# Patient Record
Sex: Male | Born: 1995 | Race: Black or African American | Hispanic: No | Marital: Married | Smoking: Never smoker
Health system: Southern US, Community
[De-identification: ages and names within clinical notes are randomized; demographics above are authoritative.]

---
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
## 2019-08-18 ENCOUNTER — Other Ambulatory Visit: Payer: Self-pay

## 2019-08-18 ENCOUNTER — Ambulatory Visit (HOSPITAL_BASED_OUTPATIENT_CLINIC_OR_DEPARTMENT_OTHER)
Admission: EM | Admit: 2019-08-18 | Discharge: 2019-08-18 | Disposition: A | Discharge status: Home / Self Care | Payer: Self-pay | Attending: Emergency Medicine | Admitting: Emergency Medicine

## 2019-08-18 ENCOUNTER — Emergency Department (HOSPITAL_BASED_OUTPATIENT_CLINIC_OR_DEPARTMENT_OTHER): Payer: Self-pay

## 2019-08-18 ENCOUNTER — Emergency Department (HOSPITAL_COMMUNITY): Payer: Self-pay | Admitting: Anesthesiology

## 2019-08-18 ENCOUNTER — Encounter (HOSPITAL_BASED_OUTPATIENT_CLINIC_OR_DEPARTMENT_OTHER): Payer: Self-pay | Admitting: Emergency Medicine

## 2019-08-18 ENCOUNTER — Encounter (HOSPITAL_COMMUNITY)
Admission: EM | Disposition: A | Discharge status: Home / Self Care | Payer: Self-pay | Source: Home / Self Care | Attending: Emergency Medicine

## 2019-08-18 DIAGNOSIS — F17200 Nicotine dependence, unspecified, uncomplicated: Secondary | ICD-10-CM | POA: Insufficient documentation

## 2019-08-18 DIAGNOSIS — Z20822 Contact with and (suspected) exposure to covid-19: Secondary | ICD-10-CM | POA: Insufficient documentation

## 2019-08-18 DIAGNOSIS — S62339B Displaced fracture of neck of unspecified metacarpal bone, initial encounter for open fracture: Secondary | ICD-10-CM

## 2019-08-18 DIAGNOSIS — W228XXA Striking against or struck by other objects, initial encounter: Secondary | ICD-10-CM | POA: Insufficient documentation

## 2019-08-18 DIAGNOSIS — S66326A Laceration of extensor muscle, fascia and tendon of right little finger at wrist and hand level, initial encounter: Secondary | ICD-10-CM | POA: Insufficient documentation

## 2019-08-18 DIAGNOSIS — S62336B Displaced fracture of neck of fifth metacarpal bone, right hand, initial encounter for open fracture: Secondary | ICD-10-CM | POA: Insufficient documentation

## 2019-08-18 HISTORY — PX: OPEN REDUCTION INTERNAL FIXATION (ORIF) METACARPAL: SHX6234

## 2019-08-18 LAB — SARS CORONAVIRUS 2 BY RT PCR (HOSPITAL ORDER, PERFORMED IN ~~LOC~~ HOSPITAL LAB): SARS Coronavirus 2: NEGATIVE

## 2019-08-18 SURGERY — OPEN REDUCTION INTERNAL FIXATION (ORIF) METACARPAL
Anesthesia: General | Site: Finger | Laterality: Right

## 2019-08-18 MED ORDER — MIDAZOLAM HCL 2 MG/2ML IJ SOLN
INTRAMUSCULAR | Status: AC
  Filled 2019-08-18: qty 2

## 2019-08-18 MED ORDER — FENTANYL CITRATE (PF) 250 MCG/5ML IJ SOLN
INTRAMUSCULAR | Status: AC
  Filled 2019-08-18: qty 5

## 2019-08-18 MED ORDER — DEXAMETHASONE SODIUM PHOSPHATE 10 MG/ML IJ SOLN
INTRAMUSCULAR | Status: DC | PRN
Start: 2019-08-18 — End: 2019-08-18
  Administered 2019-08-18: 5 mg via INTRAVENOUS

## 2019-08-18 MED ORDER — ACETAMINOPHEN 500 MG PO TABS
ORAL_TABLET | ORAL | Status: AC
  Administered 2019-08-18: 1000 mg via ORAL
  Filled 2019-08-18: qty 2

## 2019-08-18 MED ORDER — FENTANYL CITRATE (PF) 100 MCG/2ML IJ SOLN: 100.0000 ug | Freq: Once | INTRAMUSCULAR | Status: AC

## 2019-08-18 MED ORDER — LACTATED RINGERS IV SOLN
INTRAVENOUS | Status: DC | PRN
Start: 2019-08-18 — End: 2019-08-18

## 2019-08-18 MED ORDER — ORAL CARE MOUTH RINSE: 15.0000 mL | Freq: Once | OROMUCOSAL | Status: DC

## 2019-08-18 MED ORDER — FENTANYL CITRATE (PF) 100 MCG/2ML IJ SOLN
INTRAMUSCULAR | Status: AC
  Administered 2019-08-18: 100 ug via INTRAVENOUS
  Filled 2019-08-18: qty 2

## 2019-08-18 MED ORDER — FENTANYL CITRATE (PF) 100 MCG/2ML IJ SOLN: 25.0000 ug | INTRAMUSCULAR | Status: DC | PRN

## 2019-08-18 MED ORDER — MIDAZOLAM HCL 2 MG/2ML IJ SOLN: 2.0000 mg | Freq: Once | INTRAMUSCULAR | Status: AC

## 2019-08-18 MED ORDER — MORPHINE SULFATE (PF) 4 MG/ML IV SOLN
4.0000 mg | Freq: Once | INTRAVENOUS | Status: AC
  Administered 2019-08-18: 4 mg via INTRAVENOUS
  Filled 2019-08-18: qty 1

## 2019-08-18 MED ORDER — ACETAMINOPHEN 500 MG PO TABS: 1000.0000 mg | ORAL_TABLET | Freq: Once | ORAL | Status: AC

## 2019-08-18 MED ORDER — PROMETHAZINE HCL 25 MG/ML IJ SOLN
6.2500 mg | INTRAMUSCULAR | Status: DC | PRN
Start: 2019-08-18 — End: 2019-08-18

## 2019-08-18 MED ORDER — CHLORHEXIDINE GLUCONATE 0.12 % MT SOLN: 15.0000 mL | Freq: Once | OROMUCOSAL | Status: DC

## 2019-08-18 MED ORDER — BUPIVACAINE HCL (PF) 0.25 % IJ SOLN
INTRAMUSCULAR | Status: AC
  Filled 2019-08-18: qty 30

## 2019-08-18 MED ORDER — LACTATED RINGERS IV SOLN: INTRAVENOUS | Status: DC

## 2019-08-18 MED ORDER — BUPIVACAINE HCL (PF) 0.25 % IJ SOLN
INTRAMUSCULAR | Status: DC | PRN
  Administered 2019-08-18: 10 mL

## 2019-08-18 MED ORDER — CEFAZOLIN SODIUM-DEXTROSE 1-4 GM/50ML-% IV SOLN
1.0000 g | Freq: Once | INTRAVENOUS | Status: AC
  Administered 2019-08-18: 1 g via INTRAVENOUS
  Filled 2019-08-18: qty 50

## 2019-08-18 MED ORDER — 0.9 % SODIUM CHLORIDE (POUR BTL) OPTIME
TOPICAL | Status: DC | PRN
  Administered 2019-08-18: 1000 mL

## 2019-08-18 MED ORDER — LIDOCAINE HCL (CARDIAC) PF 100 MG/5ML IV SOSY
PREFILLED_SYRINGE | INTRAVENOUS | Status: DC | PRN
  Administered 2019-08-18: 60 mg via INTRAVENOUS

## 2019-08-18 MED ORDER — CHLORHEXIDINE GLUCONATE 0.12 % MT SOLN
OROMUCOSAL | Status: AC
  Filled 2019-08-18: qty 15

## 2019-08-18 MED ORDER — CEFAZOLIN SODIUM-DEXTROSE 2-4 GM/100ML-% IV SOLN
INTRAVENOUS | Status: AC
  Filled 2019-08-18: qty 100

## 2019-08-18 MED ORDER — ROPIVACAINE HCL 5 MG/ML IJ SOLN
INTRAMUSCULAR | Status: DC | PRN
Start: 2019-08-18 — End: 2019-08-18
  Administered 2019-08-18: 30 mL via PERINEURAL

## 2019-08-18 MED ORDER — HYDROCODONE-ACETAMINOPHEN 5-325 MG PO TABS: 1.0000 | ORAL_TABLET | Freq: Four times a day (QID) | ORAL | 0 refills | Status: AC | PRN

## 2019-08-18 MED ORDER — ONDANSETRON HCL 4 MG/2ML IJ SOLN
INTRAMUSCULAR | Status: DC | PRN
  Administered 2019-08-18: 4 mg via INTRAVENOUS

## 2019-08-18 MED ORDER — MIDAZOLAM HCL 2 MG/2ML IJ SOLN
INTRAMUSCULAR | Status: AC
  Administered 2019-08-18: 2 mg via INTRAVENOUS
  Filled 2019-08-18: qty 2

## 2019-08-18 MED ORDER — CLONIDINE HCL (ANALGESIA) 100 MCG/ML EP SOLN
EPIDURAL | Status: DC | PRN
  Administered 2019-08-18: 100 ug

## 2019-08-18 MED ORDER — PROPOFOL 10 MG/ML IV BOLUS
INTRAVENOUS | Status: DC | PRN
  Administered 2019-08-18: 150 mg via INTRAVENOUS

## 2019-08-18 MED ORDER — FENTANYL CITRATE (PF) 100 MCG/2ML IJ SOLN
INTRAMUSCULAR | Status: DC | PRN
  Administered 2019-08-18: 50 ug via INTRAVENOUS

## 2019-08-18 MED ORDER — CEPHALEXIN 500 MG PO CAPS: 500.0000 mg | ORAL_CAPSULE | Freq: Four times a day (QID) | ORAL | 0 refills | Status: AC

## 2019-08-18 SURGICAL SUPPLY — 45 items
ALCOHOL 70% 16 OZ (MISCELLANEOUS) ×6 IMPLANT
BNDG ELASTIC 3X5.8 VLCR STR LF (GAUZE/BANDAGES/DRESSINGS) ×3 IMPLANT
BNDG ELASTIC 4X5.8 VLCR STR LF (GAUZE/BANDAGES/DRESSINGS) ×3 IMPLANT
BNDG ESMARK 4X9 LF (GAUZE/BANDAGES/DRESSINGS) ×3 IMPLANT
BNDG GAUZE ELAST 4 BULKY (GAUZE/BANDAGES/DRESSINGS) ×3 IMPLANT
CANISTER SUCT 3000ML PPV (MISCELLANEOUS) ×3 IMPLANT
CAP PIN PROTECTOR ORTHO WHT (CAP) ×9 IMPLANT
CHLORAPREP W/TINT 26 (MISCELLANEOUS) ×3 IMPLANT
CORD BIPOLAR FORCEPS 12FT (ELECTRODE) ×3 IMPLANT
COVER SURGICAL LIGHT HANDLE (MISCELLANEOUS) ×3 IMPLANT
COVER WAND RF STERILE (DRAPES) IMPLANT
CUFF TOURN SGL QUICK 18X4 (TOURNIQUET CUFF) ×3 IMPLANT
CUFF TOURN SGL QUICK 24 (TOURNIQUET CUFF)
CUFF TRNQT CYL 24X4X16.5-23 (TOURNIQUET CUFF) IMPLANT
DRAPE OEC MINIVIEW 54X84 (DRAPES) ×3 IMPLANT
DRAPE SURG 17X23 STRL (DRAPES) ×3 IMPLANT
DRSG XEROFORM 1X8 (GAUZE/BANDAGES/DRESSINGS) ×3 IMPLANT
GAUZE SPONGE 4X4 12PLY STRL (GAUZE/BANDAGES/DRESSINGS) ×3 IMPLANT
GAUZE SPONGE 4X4 16PLY XRAY LF (GAUZE/BANDAGES/DRESSINGS) ×3 IMPLANT
GAUZE XEROFORM 1X8 LF (GAUZE/BANDAGES/DRESSINGS) IMPLANT
GLOVE INDICATOR 8.0 STRL GRN (GLOVE) ×3 IMPLANT
GLOVE SURG SYN 7.5  E (GLOVE) ×3
GLOVE SURG SYN 7.5 E (GLOVE) ×1 IMPLANT
GOWN STRL REUS W/ TWL LRG LVL3 (GOWN DISPOSABLE) ×2 IMPLANT
GOWN STRL REUS W/TWL LRG LVL3 (GOWN DISPOSABLE) ×6
K-WIRE .035 (WIRE) ×15
KIT BASIN OR (CUSTOM PROCEDURE TRAY) ×3 IMPLANT
KIT TURNOVER KIT B (KITS) ×3 IMPLANT
KWIRE .035 (WIRE) ×5 IMPLANT
NEEDLE 22X1 1/2 (OR ONLY) (NEEDLE) IMPLANT
NS IRRIG 1000ML POUR BTL (IV SOLUTION) ×3 IMPLANT
PACK ORTHO EXTREMITY (CUSTOM PROCEDURE TRAY) ×3 IMPLANT
PAD ARMBOARD 7.5X6 YLW CONV (MISCELLANEOUS) ×6 IMPLANT
PAD CAST 3X4 CTTN HI CHSV (CAST SUPPLIES) ×1 IMPLANT
PAD CAST 4YDX4 CTTN HI CHSV (CAST SUPPLIES) ×1 IMPLANT
PADDING CAST COTTON 3X4 STRL (CAST SUPPLIES) ×3
PADDING CAST COTTON 4X4 STRL (CAST SUPPLIES) ×3
SUT VIC AB 3-0 PS2 18 (SUTURE) IMPLANT
SYR CONTROL 10ML LL (SYRINGE) IMPLANT
TOWEL GREEN STERILE (TOWEL DISPOSABLE) ×3 IMPLANT
TOWEL GREEN STERILE FF (TOWEL DISPOSABLE) ×3 IMPLANT
TUBE CONNECTING 12'X1/4 (SUCTIONS) ×1
TUBE CONNECTING 12X1/4 (SUCTIONS) ×2 IMPLANT
UNDERPAD 30X36 HEAVY ABSORB (UNDERPADS AND DIAPERS) ×3 IMPLANT
WATER STERILE IRR 1000ML POUR (IV SOLUTION) ×3 IMPLANT

## 2019-08-18 NOTE — Progress Notes (Signed)
No labs needed per Dr. Desmond Lope.
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
## 2019-08-18 NOTE — Anesthesia Procedure Notes (Signed)
Procedure Name: LMA Insertion Date/Time: 08/18/2019 10:14 AM Performed by: Gwenyth Allegra, CRNA Pre-anesthesia Checklist: Patient identified, Emergency Drugs available, Suction available, Patient being monitored and Timeout performed Patient Re-evaluated:Patient Re-evaluated prior to induction Oxygen Delivery Method: Circle system utilized Preoxygenation: Pre-oxygenation with 100% oxygen Induction Type: IV induction LMA: LMA inserted LMA Size: 4.0 Number of attempts: 1 Placement Confirmation: positive ETCO2 and breath sounds checked- equal and bilateral Tube secured with: Tape Dental Injury: Teeth and Oropharynx as per pre-operative assessment

## 2019-08-18 NOTE — Anesthesia Procedure Notes (Signed)
Anesthesia Regional Block: Axillary brachial plexus block   Pre-Anesthetic Checklist: ,, timeout performed, Correct Patient, Correct Site, Correct Laterality, Correct Procedure, Correct Position, site marked, Risks and benefits discussed,  Surgical consent,  Pre-op evaluation,  At surgeon's request and post-op pain management  Laterality: Right  Prep: chloraprep       Needles:  Injection technique: Single-shot  Needle Type: Echogenic Needle     Needle Length: 9cm  Needle Gauge: 21     Additional Needles:   Procedures:,,,, ultrasound used (permanent image in chart),,,,  Narrative:  Start time: 08/18/2019 9:40 AM End time: 08/18/2019 9:50 AM Injection made incrementally with aspirations every 5 mL.  Performed by: Personally  Anesthesiologist: Cecile Hearing, MD  Additional Notes: No pain on injection. No increased resistance to injection. Injection made in 5cc increments.  Good needle visualization.  Patient tolerated procedure well.

## 2019-08-18 NOTE — Anesthesia Preprocedure Evaluation (Addendum)
Anesthesia Evaluation  Patient identified by MRN, date of birth, ID band Patient awake    Reviewed: Allergy & Precautions, NPO status , Patient's Chart, lab work & pertinent test results  Airway Mallampati: II  TM Distance: >3 FB Neck ROM: Full    Dental  (+) Teeth Intact, Dental Advisory Given   Pulmonary Current Smoker and Patient abstained from smoking.,    Pulmonary exam normal breath sounds clear to auscultation       Cardiovascular negative cardio ROS Normal cardiovascular exam Rhythm:Regular Rate:Normal     Neuro/Psych Seizures -,  negative psych ROS   GI/Hepatic negative GI ROS, Neg liver ROS,   Endo/Other  negative endocrine ROS  Renal/GU negative Renal ROS     Musculoskeletal Right Metacarpal Fracture   Abdominal   Peds  Hematology negative hematology ROS (+)   Anesthesia Other Findings   Reproductive/Obstetrics                            Anesthesia Physical Anesthesia Plan  ASA: II  Anesthesia Plan: General   Post-op Pain Management:  Regional for Post-op pain   Induction: Intravenous  PONV Risk Score and Plan: 2 and Treatment may vary due to age or medical condition, Dexamethasone, Ondansetron and Midazolam  Airway Management Planned: LMA  Additional Equipment:   Intra-op Plan:   Post-operative Plan: Extubation in OR  Informed Consent: I have reviewed the patients History and Physical, chart, labs and discussed the procedure including the risks, benefits and alternatives for the proposed anesthesia with the patient or authorized representative who has indicated his/her understanding and acceptance.     Dental advisory given  Plan Discussed with: CRNA  Anesthesia Plan Comments:        Anesthesia Quick Evaluation

## 2019-08-18 NOTE — Consult Note (Signed)
ORTHOPAEDIC CONSULTATION  REQUESTING PHYSICIAN: No att. providers found  PCP:  Milford Cage Francoise Schaumann, MD (Inactive)  Chief Complaint: Right hand pain  HPI: Kenneth Nelson is a 24 y.o. male who complains of right hand pain.  Last night the patient punched a metal sign.  He had immediate pain as well as bleeding from the wound on his right hand.  He was seen at [facility] where he was found to have a significantly displaced right fifth metacarpal neck fracture with concern for an open fracture.  Hand surgery was consulted for further care.  He states that his pain is remained constant in his hand.  He received IV Ancef at [facility].  He was transferred to [facility] for definitive treatment of his right hand open fracture.  He denies numbness or tingling to the digits.  He had no pain in the hand prior to the injury.  History reviewed. No pertinent past medical history. History reviewed. No pertinent surgical history. Social History   Socioeconomic History  . Marital status: Single    Spouse name: Not on file  . Number of children: Not on file  . Years of education: Not on file  . Highest education level: Not on file  Occupational History  . Not on file  Tobacco Use  . Smoking status: Never Smoker  . Smokeless tobacco: Never Used  Substance and Sexual Activity  . Alcohol use: No  . Drug use: No  . Sexual activity: Never  Other Topics Concern  . Not on file  Social History Narrative  . Not on file   Social Determinants of Health   Financial Resource Strain:   . Difficulty of Paying Living Expenses:   Food Insecurity:   . Worried About Programme researcher, broadcasting/film/video in the Last Year:   . Barista in the Last Year:   Transportation Needs:   . Freight forwarder (Medical):   Marland Kitchen Lack of Transportation (Non-Medical):   Physical Activity:   . Days of Exercise per Week:   . Minutes of Exercise per Session:   Stress:   . Feeling of Stress :   Social  Connections:   . Frequency of Communication with Friends and Family:   . Frequency of Social Gatherings with Friends and Family:   . Attends Religious Services:   . Active Member of Clubs or Organizations:   . Attends Banker Meetings:   Marland Kitchen Marital Status:    Family History  Problem Relation Age of Onset  . Sudden death Neg Hx   . Heart attack Neg Hx    No Known Allergies Prior to Admission medications   Medication Sig Start Date End Date Taking? Authorizing Provider  sulfamethoxazole-trimethoprim (SEPTRA) 400-80 MG per tablet Take 1 tablet by mouth 2 (two) times daily. 11/20/12   Elson Areas, PA-C   DG Hand Complete Right  Result Date: 08/18/2019 CLINICAL DATA:  Punched a street sign, deformity EXAM: RIGHT HAND - COMPLETE 3+ VIEW COMPARISON:  None. FINDINGS: Frontal, oblique, and lateral views of the right hand are obtained. There is an impacted comminuted intra-articular fracture of the distal aspect of the fifth metacarpal, with volar angulation. Marked overlying soft tissue swelling. No other acute displaced fractures. IMPRESSION: 1. Comminuted impacted intra-articular fracture distal aspect fifth metacarpal, with volar angulation. Electronically Signed   By: Sharlet Salina M.D.   On: 08/18/2019 03:57    Positive ROS: All other systems  have been reviewed and were otherwise negative with the exception of those mentioned in the HPI and as above.  Physical Exam: General: Alert, no acute distress Cardiovascular: No edema Respiratory: No cyanosis, no use of accessory musculature Skin: 2 mm open wound over the dorsal aspect of the fifth metacarpal neck with oozing blood Psychiatric: Patient is competent for consent with normal mood and affect  MUSCULOSKELETAL: Examination of the right hand shows a 2 mm wound over the dorsal aspect of the fifth metacarpal neck with oozing blood.  There is mild swelling to the hand.  He has tenderness palpation to the fifth metacarpal.  He  has no tenderness elsewhere in the hand.  He has palpable flexion deformity of the fifth metacarpal as well as internal rotation of the digit.  Range of motion was unable to be evaluated secondary to his hand pain.  His fingertips are warm well perfused with brisk capillary refill.  His sensation is grossly intact light touch throughout all digits.  Assessment: Displaced and open right fifth metacarpal neck fracture  Plan: I discussed treatment options with the patient I did recommend proceeding forward with irrigation debridement of his right fifth metacarpal open fracture.  Plan would be to then perform surgical fixation either with plate and screw construct, intramedullary screw fixation or K wires depending on the stability of the fracture.  The risks of surgery were discussed with the patient.  These risks include but not limited to infection, bleeding, damage to surrounding structures including blood vessels and nerves, pain, stiffness, malunion, nonunion, implant failure and need for additional procedures.  Informed consent was obtained.  The patient's right hand was marked.  Plan for discharge home postoperatively with close follow-up with me in clinic to continue postoperative care and rehab.    Ernest Mallick, MD [phone]   08/18/2019 9:37 AM

## 2019-08-18 NOTE — Transfer of Care (Signed)
Immediate Anesthesia Transfer of Care Note  Patient: Kenneth Nelson  Procedure(s) Performed: OPEN REDUCTION INTERNAL FIXATION (ORIF) METACARPAL (Right Finger)  Patient Location: PACU  Anesthesia Type:GA combined with regional for post-op pain  Level of Consciousness: awake, alert  and oriented  Airway & Oxygen Therapy: Patient Spontanous Breathing  Post-op Assessment: Report given to RN and Post -op Vital signs reviewed and stable  Post vital signs: Reviewed and stable  Last Vitals:  Vitals Value Taken Time  BP 107/81 08/18/19 1206  Temp 36.7 C 08/18/19 1205  Pulse 55 08/18/19 1213  Resp 11 08/18/19 1213  SpO2 99 % 08/18/19 1213  Vitals shown include unvalidated device data.  Last Pain:  Vitals:   08/18/19 1205  TempSrc:   PainSc: Asleep      Patients Stated Pain Goal: 4 (08/18/19 0933)  Complications: No complications documented.

## 2019-08-18 NOTE — Anesthesia Procedure Notes (Signed)
Anesthesia Regional Block: Axillary brachial plexus block   Pre-Anesthetic Checklist: ,, timeout performed, Correct Patient, Correct Site, Correct Laterality, Correct Procedure, Correct Position, site marked, Risks and benefits discussed,  Surgical consent,  Pre-op evaluation,  At surgeon's request and post-op pain management  Laterality: Right  Prep: chloraprep       Needles:  Injection technique: Single-shot  Needle Type: Echogenic Needle     Needle Length: 9cm  Needle Gauge: 21     Additional Needles:   Procedures:,,,, ultrasound used (permanent image in chart),,,,  Narrative:  Start time: 08/18/2019 9:40 AM End time: 08/18/2019 9:50 AM Injection made incrementally with aspirations every 5 mL.  Performed by: Personally  Anesthesiologist: Evalyse Stroope Edward, MD  Additional Notes: No pain on injection. No increased resistance to injection. Injection made in 5cc increments.  Good needle visualization.  Patient tolerated procedure well.        

## 2019-08-18 NOTE — Anesthesia Postprocedure Evaluation (Signed)
Anesthesia Post Note  Patient: Kenneth Nelson  Procedure(s) Performed: OPEN REDUCTION INTERNAL FIXATION (ORIF) METACARPAL (Right Finger)     Patient location during evaluation: PACU Anesthesia Type: General Level of consciousness: awake and alert and awake Pain management: pain level controlled Vital Signs Assessment: post-procedure vital signs reviewed and stable Respiratory status: spontaneous breathing, nonlabored ventilation, respiratory function stable and patient connected to nasal cannula oxygen Cardiovascular status: blood pressure returned to baseline and stable Postop Assessment: no apparent nausea or vomiting Anesthetic complications: no   No complications documented.  Last Vitals:  Vitals:   08/18/19 1220 08/18/19 1235  BP: 105/76 95/73  Pulse: 55 53  Resp: 12 12  Temp:    SpO2: 100% 100%    Last Pain:  Vitals:   08/18/19 1235  TempSrc:   PainSc: 0-No pain                 Cecile Hearing

## 2019-08-18 NOTE — Discharge Instructions (Signed)
Discharge Instructions  - Keep dressings in place. Do not remove them. - The dressings must stay dry - Take all medication as prescribed. Transition to over the counter pain medication as your pain improves - Keep the hand elevated over the next 48-72 hours to help with pain and swelling - Move all digits not restricted by the dressings regularly to prevent stiffness - Please call to schedule a follow up appointment with Dr. Wendelin Reader and therapy at [phone] for 10-14 days following surgery - Your pain medication have been sent digitally to your pharmacy  

## 2019-08-18 NOTE — ED Provider Notes (Signed)
[facility] EMERGENCY DEPARTMENT Provider Note  CSN: [id number] Arrival date & time: 08/18/19 0254  Chief Complaint(s) Hand Injury  HPI Kenneth Nelson is a 24 y.o. male   CC: Right hand pain  Onset/Duration: 30 minutes prior to arrival Timing: Constant unchanged Quality: Throbbing Severity: Severe Modifying Factors:  Improved by: Immobility  Worsened by: Movement and palpation Associated Signs/Symptoms:  Pertinent (+): Bleeding and wound over the deformity, difficulty moving his pinky  Pertinent (-): Numbness or tingling Context: Patient reports that he punched a handicap sign because he was mad.  Up-to-date on tetanus vaccination   HPI  Past Medical History History reviewed. No pertinent past medical history. Patient Active Problem List   Diagnosis Date Noted  . Sports physical 07/03/2011   Home Medication(s) Prior to Admission medications   Medication Sig Start Date End Date Taking? Authorizing Provider  sulfamethoxazole-trimethoprim (SEPTRA) 400-80 MG per tablet Take 1 tablet by mouth 2 (two) times daily. 11/20/12   Osie Cheeks                                                                                                                                    Past Surgical History History reviewed. No pertinent surgical history. Family History Family History  Problem Relation Age of Onset  . Sudden death Neg Hx   . Heart attack Neg Hx     Social History Social History   Tobacco Use  . Smoking status: Never Smoker  . Smokeless tobacco: Never Used  Substance Use Topics  . Alcohol use: No  . Drug use: No   Allergies Patient has no known allergies.  Review of Systems Review of Systems All other systems are reviewed and are negative for acute change except as noted in the HPI  Physical Exam Vital Signs  I have reviewed the triage vital signs BP 114/80 (BP Location: Left Arm)   Pulse 74   Temp 98.5 F (36.9 C) (Oral)   Resp 20   Ht  5\' 8"  (1.727 m)   Wt 64.4 kg   SpO2 100%   BMI 21.59 kg/m   Physical Exam Vitals reviewed.  Constitutional:      General: He is not in acute distress.    Appearance: He is well-developed. He is not diaphoretic.  HENT:     Head: Normocephalic and atraumatic.     Jaw: No trismus.     Right Ear: External ear normal.     Left Ear: External ear normal.     Nose: Nose normal.  Eyes:     General: No scleral icterus.    Conjunctiva/sclera: Conjunctivae normal.  Neck:     Trachea: Phonation normal.  Cardiovascular:     Rate and Rhythm: Normal rate and regular rhythm.  Pulmonary:     Effort: Pulmonary effort is normal. No respiratory distress.     Breath sounds: No stridor.  Abdominal:  General: There is no distension.  Musculoskeletal:        General: Normal range of motion.       Hands:     Cervical back: Normal range of motion.  Neurological:     Mental Status: He is alert and oriented to person, place, and time.  Psychiatric:        Behavior: Behavior normal.     ED Results and Treatments Labs (all labs ordered are listed, but only abnormal results are displayed) Labs Reviewed  SARS CORONAVIRUS 2 BY RT PCR (HOSPITAL ORDER, PERFORMED IN [place] [facility])                                                                                                                         EKG  EKG Interpretation  Date/Time:    Ventricular Rate:    PR Interval:    QRS Duration:   QT Interval:    QTC Calculation:   R Axis:     Text Interpretation:        Radiology DG Hand Complete Right  Result Date: 08/18/2019 CLINICAL DATA:  Punched a street sign, deformity EXAM: RIGHT HAND - COMPLETE 3+ VIEW COMPARISON:  None. FINDINGS: Frontal, oblique, and lateral views of the right hand are obtained. There is an impacted comminuted intra-articular fracture of the distal aspect of the fifth metacarpal, with volar angulation. Marked overlying soft tissue swelling. No other acute  displaced fractures. IMPRESSION: 1. Comminuted impacted intra-articular fracture distal aspect fifth metacarpal, with volar angulation. Electronically Signed   By: Sharlet Salina M.D.   On: 08/18/2019 03:57    Pertinent labs & imaging results that were available during my care of the patient were reviewed by me and considered in my medical decision making (see chart for details).  Medications Ordered in ED Medications  ceFAZolin (ANCEF) IVPB 1 g/50 mL premix (0 g Intravenous Stopped 08/18/19 0509)  morphine 4 MG/ML injection 4 mg (4 mg Intravenous Given 08/18/19 0620)                                                                                                                                    Procedures .Critical Care Performed by: Nira Conn, MD Authorized by: Nira Conn, MD    CRITICAL CARE Performed by: Amadeo Garnet Apryl Brymer Total critical care time: 35 minutes Critical care time was exclusive of separately billable  procedures and treating other patients. Critical care was necessary to treat or prevent imminent or life-threatening deterioration. Critical care was time spent personally by me on the following activities: development of treatment plan with patient and/or surrogate as well as nursing, discussions with consultants, evaluation of patient's response to treatment, examination of patient, obtaining history from patient or surrogate, ordering and performing treatments and interventions, ordering and review of laboratory studies, ordering and review of radiographic studies, pulse oximetry and re-evaluation of patient's condition.    (including critical care time)  Medical Decision Making / ED Course I have reviewed the nursing notes for this encounter and the patient's prior records (if available in EHR or on provided paperwork).   Rhett Mutschler Rolston was evaluated in Emergency Department on 08/18/2019 for the symptoms described in the history of present  illness. He was evaluated in the context of the global COVID-19 pandemic, which necessitated consideration that the patient might be at risk for infection with the SARS-CoV-2 virus that causes COVID-19. Institutional protocols and algorithms that pertain to the evaluation of patients at risk for COVID-19 are in a state of rapid change based on information released by regulatory bodies including the CDC and federal and state organizations. These policies and algorithms were followed during the patient's care in the ED.  Plain film confirmed boxer's fracture.  There is a jagged piece of the metacarpal which may be the source of the puncture wound just above.  This is concerning for open fracture.  Patient given Ancef.  Case discussed with Dr. Roney Mans of hand surgery who requested patient be transferred to [facility] for operative management.        Final Clinical Impression(s) / ED Diagnoses Final diagnoses:  Open boxer's fracture, initial encounter      This chart was dictated using voice recognition software.  Despite best efforts to proofread,  errors can occur which can change the documentation meaning.   Nira Conn, MD 08/18/19 [phone]

## 2019-08-19 ENCOUNTER — Encounter (HOSPITAL_COMMUNITY): Payer: Self-pay | Admitting: Orthopaedic Surgery

## 2019-09-04 ENCOUNTER — Ambulatory Visit: Payer: Self-pay | Attending: Orthopaedic Surgery | Admitting: *Deleted

## 2019-09-04 DIAGNOSIS — R278 Other lack of coordination: Secondary | ICD-10-CM | POA: Insufficient documentation

## 2019-09-04 DIAGNOSIS — R601 Generalized edema: Secondary | ICD-10-CM | POA: Insufficient documentation

## 2019-09-04 DIAGNOSIS — M79644 Pain in right finger(s): Secondary | ICD-10-CM | POA: Insufficient documentation

## 2019-09-04 DIAGNOSIS — M25541 Pain in joints of right hand: Secondary | ICD-10-CM | POA: Insufficient documentation

## 2019-09-04 DIAGNOSIS — M6281 Muscle weakness (generalized): Secondary | ICD-10-CM | POA: Insufficient documentation

## 2019-09-05 ENCOUNTER — Other Ambulatory Visit: Payer: Self-pay

## 2019-09-05 ENCOUNTER — Encounter: Payer: Self-pay | Admitting: *Deleted

## 2019-09-05 ENCOUNTER — Ambulatory Visit: Payer: Self-pay | Admitting: *Deleted

## 2019-09-05 DIAGNOSIS — M6281 Muscle weakness (generalized): Secondary | ICD-10-CM

## 2019-09-05 DIAGNOSIS — R278 Other lack of coordination: Secondary | ICD-10-CM

## 2019-09-05 DIAGNOSIS — R601 Generalized edema: Secondary | ICD-10-CM

## 2019-09-05 DIAGNOSIS — M79644 Pain in right finger(s): Secondary | ICD-10-CM

## 2019-09-05 DIAGNOSIS — M25541 Pain in joints of right hand: Secondary | ICD-10-CM

## 2019-09-05 NOTE — Patient Instructions (Signed)
WEARING SCHEDULE:  Wear splint at ALL times except for hygiene care. May remove splint for pin care only, then reapply. Do not move or use that hand for anything at this time, your tendon and fractures need time to heal.  PURPOSE:  To prevent movement and for protection until injury can heal  CARE OF SPLINT:  Keep splint away from heat sources including: stove, radiator or furnace, or a car in sunlight. The splint can melt and will no longer fit you properly  Keep away from pets and children  Spot lean the splint with rubbing alcohol as needed.  * During this time, make sure you also clean your hand/arm as instructed by your therapist and/or perform dressing changes as needed. Then dry hand/arm completely before replacing splint. (When cleaning hand/arm, keep it immobilized in same position until splint is replaced)  PRECAUTIONS/POTENTIAL PROBLEMS: *If you notice or experience increased pain, swelling, numbness, or a lingering reddened area from the splint: Contact your therapist immediately by calling [phone]. You must wear the splint for protection, but we will get you scheduled for adjustments as quickly as possible.  (If only straps or hooks need to be replaced and NO adjustments to the splint need to be made, just call the office ahead and let them know you are coming in)  If you have any medical concerns or signs of infection, please call your doctor immediately
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
## 2019-09-12 ENCOUNTER — Ambulatory Visit: Payer: Self-pay | Admitting: *Deleted

## 2019-09-12 ENCOUNTER — Other Ambulatory Visit: Payer: Self-pay

## 2019-09-12 ENCOUNTER — Encounter: Payer: Self-pay | Admitting: *Deleted

## 2019-09-12 DIAGNOSIS — M25541 Pain in joints of right hand: Secondary | ICD-10-CM

## 2019-09-12 DIAGNOSIS — R278 Other lack of coordination: Secondary | ICD-10-CM

## 2019-09-12 DIAGNOSIS — R601 Generalized edema: Secondary | ICD-10-CM

## 2019-09-12 DIAGNOSIS — M6281 Muscle weakness (generalized): Secondary | ICD-10-CM

## 2019-09-12 DIAGNOSIS — M79644 Pain in right finger(s): Secondary | ICD-10-CM

## 2019-09-12 NOTE — Therapy (Signed)
[facility] [address] [place], [place], [address] Phone: [phone]   Fax:  [phone]  Occupational Therapy Treatment  Patient Details  Name: Kenneth Nelson MRN: [id number] Date of Birth: 07/28/1995 Referring Provider (OT): Dr Candi Leash   Encounter Date: 09/12/2019   OT End of Session - 09/12/19 1209    Visit Number 2    Number of Visits 16    Date for OT Re-Evaluation 10/31/19    Authorization Type Pt is currently self pay. He was going to ask the front office for handouts [id number] financial assistance as he is currently unable to work.    OT Start Time 1111    OT Stop Time 1154    OT Time Calculation (min) 43 min    Activity Tolerance Patient tolerated treatment well;No increased pain    Behavior During Therapy [facility] for tasks assessed/performed           History reviewed. No pertinent past medical history.  Past Surgical History:  Procedure Laterality Date  . OPEN REDUCTION INTERNAL FIXATION (ORIF) METACARPAL Right 08/18/2019   Procedure: OPEN REDUCTION INTERNAL FIXATION (ORIF) METACARPAL;  Surgeon: Ernest Mallick, MD;  Location: [facility] OR;  Service: Orthopedics;  Laterality: Right;    There were no vitals filed for this visit.   Subjective Assessment - 09/12/19 1114    Subjective  Pt reports that he feels as though some of the pins may have moved. He states that he is having some minor pain related to the pins being in his hand.    Pertinent History Non significant per pt report.    Patient Stated Goals Get hand back to normal    Currently in Pain? Yes    Pain Score 7     Pain Location Hand    Pain Orientation Right    Pain Descriptors / Indicators Aching;Pressure;Throbbing    Pain Type Acute pain    Pain Onset 1 to 4 weeks ago    Pain Frequency Intermittent    Aggravating Factors  Pt reports increased pain when resting hand on its ulnar side within the splint as pins can be felt.    Pain  Relieving Factors Repositioning hand/forearm    Multiple Pain Sites No                        OT Treatments/Exercises (OP) - 09/12/19 0001      ADLs   ADL Comments Pt was advised to maintain neutral wrist/forearm position as he may be unintentionally moving too much within his splint causing iirritation at his pin sites. Sleeping and resting positioning and edema control was also reviewed verbally in the clinic today.      Splinting   Splinting Pt protective splint was removed and minor adjustments were made to increase comfort/fit around pin sites, most notably at ulnar wrist just distal to ulnar styloid. Straping and velcro was also replaced as pt appears to have been removing straps frequently as they were not completely adhered any more. Pt had c/o soreness at 4th website/pin area and ulnar sided  wrist area, it was discussed that he may be moving too much within the splint during the day ie> during ADL's etc he may be pronating and supinating his forearm unintentionally which would cause some irritation at the ulnar styloid and pins sites. He was advised to keep his forearm in the neutral position for comfort and adjust straps as needed. He also  reports that he does not have a f/u appointment iwth Dr Roney Mans and had attempted to call the office to schedule a f/u to check pin sites and further recommendations for progression of care following their removal. He stated that he planned to go to the doctor's office after leaving here to make an appointment.                   OT Education - 09/12/19 1208    Education Details Splint use, care and precautions. Maintain neutral wrist/forearm position for comfort. Avoid functional use R hand during ADL's as it appears pt has moved some within the splint possibly causing irritation at ulnar sided pin sites.    Person(s) Educated Patient;Other (comment)   brother   Methods Explanation;Demonstration;Verbal cues;Handout     Comprehension Verbalized understanding;Returned demonstration;Verbal cues required            OT Short Term Goals - 09/05/19 1131      OT SHORT TERM GOAL #1   Title Pt will be Mod I splinting use, care and precautions R hand/wrist.    Baseline req min-mod vc's to not use right hand.or move fingers within splint    Time 4    Period Weeks    Status New    Target Date 10/03/19      OT SHORT TERM GOAL #2   Title Pt will be Mod I edema control techniques R UE    Baseline Min-mod edema noted R hand/fingers    Time 4    Period Weeks    Status New    Target Date 10/03/19      OT SHORT TERM GOAL #3   Title Pt will be Mod I pin care R dorsal hand    Baseline req Min vc's    Time 4    Period Weeks    Status New    Target Date 10/03/19             OT Long Term Goals - 09/05/19 1133      OT LONG TERM GOAL #1   Title Pt will be Mod I upgraded HEP    Baseline dependent    Time 8    Period Weeks    Status New    Target Date 10/31/19      OT LONG TERM GOAL #2   Title Pt will demonstrate right small finger active flexion to his [facility] or be able to make a full fist during functional ADL's    Baseline unable due to fracture and tendon repairs    Time 8    Period Weeks    Status New    Target Date 10/31/19      OT LONG TERM GOAL #3   Title Pt will be able to extend his right ring and small fingers prior to grasp/release & picking up light objects during ADL's as seen in clinic.    Baseline Unable    Time 8    Period Weeks    Status New    Target Date 10/31/19      OT LONG TERM GOAL #4   Title Pt will report decreased pain of 3/10 or less with functional use of R hand during slimulated ADL's.    Baseline Unable    Time 8    Period Weeks    Status New    Target Date 10/31/19                 Plan -  09/12/19 1210    Clinical Impression Statement Pt was seen today for protective splint check and adjustments. Pt should benefit from maintaining neutral wrist  position throughout the day as he has c/o pin mirgration and pain in 4th webspace as well as ulnar side or wrist. Pt may be unintentionally pronating and supinating R forearm throughout the day therefore causing irritation at pin sites. He should benefit from f/u Dr Roney Mans and progression of HEP as per updated orders from MD following pin removal.    OT Occupational Profile and History Problem Focused Assessment - Including review of records relating to presenting problem    Occupational performance deficits (Please refer to evaluation for details): ADL's    Body Structure / Function / Physical Skills ADL;Strength;Dexterity;Pain;Edema;UE functional use;ROM;Scar mobility;Coordination;Flexibility;Mobility;Sensation;Wound;FMC    Rehab Potential Good    Clinical Decision Making Limited treatment options, no task modification necessary    Comorbidities Affecting Occupational Performance: May have comorbidities impacting occupational performance    Modification or Assistance to Complete Evaluation  No modification of tasks or assist necessary to complete eval    OT Frequency 2x / week    OT Duration 8 weeks    OT Treatment/Interventions Self-care/ADL training;Fluidtherapy;Splinting;Therapeutic activities;Ultrasound;Therapeutic exercise;Scar mobilization;Passive range of motion;Paraffin;Manual Therapy;Patient/family education    Plan Splint check and adjustments R hand/wrist, edema control, progression of HEP following pin removal as per Dr Hinda Glatter orders.    Consulted and Agree with Plan of Care Patient           Patient will benefit from skilled therapeutic intervention in order to improve the following deficits and impairments:   Body Structure / Function / Physical Skills: ADL, Strength, Dexterity, Pain, Edema, UE functional use, ROM, Scar mobility, Coordination, Flexibility, Mobility, Sensation, Wound, FMC       Visit Diagnosis: Pain in joint of right hand  Pain in right  finger(s)  Generalized edema  Muscle weakness (generalized)  Other lack of coordination    Problem List Patient Active Problem List   Diagnosis Date Noted  . Sports physical 07/03/2011    Alm Bustard, OTR/L 09/12/2019, 12:15 PM  [facility] [address] [place], [place], [address] Phone: [phone]   Fax:  [phone]  Name: Kenneth Nelson MRN: [id number] Date of Birth: 04/10/1995

## 2019-09-18 ENCOUNTER — Encounter: Payer: Self-pay | Admitting: *Deleted

## 2019-09-18 ENCOUNTER — Ambulatory Visit: Payer: Self-pay | Admitting: *Deleted

## 2019-09-18 ENCOUNTER — Other Ambulatory Visit: Payer: Self-pay

## 2019-09-18 DIAGNOSIS — M6281 Muscle weakness (generalized): Secondary | ICD-10-CM

## 2019-09-18 DIAGNOSIS — M79644 Pain in right finger(s): Secondary | ICD-10-CM

## 2019-09-18 DIAGNOSIS — R278 Other lack of coordination: Secondary | ICD-10-CM

## 2019-09-18 DIAGNOSIS — R601 Generalized edema: Secondary | ICD-10-CM

## 2019-09-18 DIAGNOSIS — M25541 Pain in joints of right hand: Secondary | ICD-10-CM

## 2019-09-18 NOTE — Therapy (Signed)
[facility] [address] [place], [place], [address] Phone: [phone]   Fax:  [phone]  Occupational Therapy Treatment  Patient Details  Name: Kenneth Nelson MRN: [id number] Date of Birth: 02/12/1995 Referring Provider (OT): Dr Candi Leash   Encounter Date: 09/18/2019   OT End of Session - 09/18/19 1202    Visit Number 3    Number of Visits 16    Date for OT Re-Evaluation 10/31/19    Authorization Type Pt is currently self pay. He was going to ask the front office for handouts [id number] financial assistance as he is currently unable to work.    OT Start Time 1059    OT Stop Time 1145    OT Time Calculation (min) 46 min    Activity Tolerance Patient tolerated treatment well;No increased pain    Behavior During Therapy [facility] for tasks assessed/performed           History reviewed. No pertinent past medical history.  Past Surgical History:  Procedure Laterality Date  . OPEN REDUCTION INTERNAL FIXATION (ORIF) METACARPAL Right 08/18/2019   Procedure: OPEN REDUCTION INTERNAL FIXATION (ORIF) METACARPAL;  Surgeon: Ernest Mallick, MD;  Location: [facility] OR;  Service: Orthopedics;  Laterality: Right;    There were no vitals filed for this visit.   Subjective Assessment - 09/18/19 1101    Subjective  Pt states that his pins were removed on 09/13/2019 right hand. Pt states that he was told he "could start moving his hand in therapy now."    Patient is accompanied by: Family member   Brother   Pertinent History Non significant per pt report.    Patient Stated Goals Get hand back to normal    Currently in Pain? No/denies    Pain Score 0-No pain    Pain Orientation Right    Multiple Pain Sites No                        OT Treatments/Exercises (OP) - 09/18/19 0001      ADLs   ADL Comments Pt was educated that he may remove splint to shower and then reapply after thouroughly dried. He was educated  to not use R hand for any other functional activity at this time (No lifting, pushing or pulling). with R hand. He was also educated in no soaking hand in sink or tub. He verbalized understanding of all of the above in clinic today.      Exercises   Exercises Wrist;Hand   Gentle AROM for tendon gliding     Hand Exercises   Other Hand Exercises Right Tendon gliding active ROM and gentle active wrist flexion/extension.      Modalities   Modalities Fluidotherapy      RUE Fluidotherapy   Number Minutes Fluidotherapy 10 Minutes    RUE Fluidotherapy Location Hand;Wrist    Comments Pt performing gentle AROM while in fluido for flexibity, tendon gliding.      Splinting   Splinting Pt splint was modified right to hand based to P1 with some MCP flexion to about 25* at this time. Will adjust for increased MCP flexion as able. Will monitor as per 5th MCP metacarpal fracture as well as EDC repair.       Manual Therapy   Manual Therapy --           Do each exercise below 2 sets of  x10 reps. Hold at end range for count of  3-5 seconds. Do these at least 6 times a day. Wear splint between exercises.   Flexor Tendon Gliding (Active Straight Fist)    Start with fingers straight. Bend knuckles and middle joints. Keep fingertip joints straight to touch base of palm.   Copyright  VHI. All rights reserved.  Flexor Tendon Gliding (Active Hook Fist)    With fingers and knuckles straight, bend middle and tip joints. Do not bend large knuckles.   Copyright  VHI. All rights reserved.  Flexor Tendon Gliding (Active Full Fist)    Straighten all fingers, then make a fist, bending all joints.   Copyright  VHI. All rights reserved.     OT Education - 09/18/19 1159    Education Details Splinting use, care and precautions as well as upgrade of HEP for gentle active ROM per MD after ppin removal on 09/13/2019. Cont with no lifting/pushing/pulling at this time.    Person(s) Educated  Patient;Other (comment)   Brother   Methods Explanation;Demonstration;Verbal cues;Handout    Comprehension Verbalized understanding;Returned demonstration;Verbal cues required            OT Short Term Goals - 09/18/19 1212      OT SHORT TERM GOAL #1   Title Pt will be Mod I splinting use, care and precautions R hand/wrist.    Baseline req min-mod vc's to not use right hand.or move fingers within splint    Time 4    Period Weeks    Status Achieved    Target Date 10/03/19      OT SHORT TERM GOAL #2   Title Pt will be Mod I edema control techniques R UE    Baseline Min-mod edema noted R hand/fingers    Time 4    Period Weeks    Status On-going    Target Date 10/03/19      OT SHORT TERM GOAL #3   Title Pt will be Mod I pin care R dorsal hand    Baseline req Min vc's    Time 4    Period Weeks    Status Achieved    Target Date 10/03/19             OT Long Term Goals - 09/05/19 1133      OT LONG TERM GOAL #1   Title Pt will be Mod I upgraded HEP    Baseline dependent    Time 8    Period Weeks    Status New    Target Date 10/31/19      OT LONG TERM GOAL #2   Title Pt will demonstrate right small finger active flexion to his [facility] or be able to make a full fist during functional ADL's    Baseline unable due to fracture and tendon repairs    Time 8    Period Weeks    Status New    Target Date 10/31/19      OT LONG TERM GOAL #3   Title Pt will be able to extend his right ring and small fingers prior to grasp/release & picking up light objects during ADL's as seen in clinic.    Baseline Unable    Time 8    Period Weeks    Status New    Target Date 10/31/19      OT LONG TERM GOAL #4   Title Pt will report decreased pain of 3/10 or less with functional use of R hand during slimulated ADL's.    Baseline Unable  Time 8    Period Weeks    Status New    Target Date 10/31/19                 Plan - 09/18/19 1204    Clinical Impression Statement Pt seen  in clinic today for upgrade of his HEP as per right 5th metacarpal fracture (Pins removed on 09/13/2019 and MD ok'd gentle active range of motion per protocol) and R EDC repair. He is currently 4 weeks and 3 days post-op. Pt splint adjustment and initiation of HEP for tendon gliding should allow for gradual MCP flexion of 5th MCP (as pt was initially splinted in neutral/extension for [facility] repair).  Pt  will wear splint between exercise sessions .    OT Occupational Profile and History Problem Focused Assessment - Including review of records relating to presenting problem    Occupational performance deficits (Please refer to evaluation for details): ADL's    Body Structure / Function / Physical Skills ADL;Strength;Dexterity;Pain;Edema;UE functional use;ROM;Scar mobility;Coordination;Flexibility;Mobility;Sensation;Wound;FMC    Rehab Potential Good    Clinical Decision Making Limited treatment options, no task modification necessary    Comorbidities Affecting Occupational Performance: May have comorbidities impacting occupational performance    Modification or Assistance to Complete Evaluation  No modification of tasks or assist necessary to complete eval    OT Frequency 2x / week    OT Duration 8 weeks    OT Treatment/Interventions Self-care/ADL training;Fluidtherapy;Splinting;Therapeutic activities;Ultrasound;Therapeutic exercise;Scar mobilization;Passive range of motion;Paraffin;Manual Therapy;Patient/family education    Plan Check hand based splint and adjustment as necessary for increased 5th MCP flexion R hand. Cont edema control, & upgrade HEP as per 5th metacarpal fracture and EDC repair.    Consulted and Agree with Plan of Care Patient           Patient will benefit from skilled therapeutic intervention in order to improve the following deficits and impairments:   Body Structure / Function / Physical Skills: ADL, Strength, Dexterity, Pain, Edema, UE functional use, ROM, Scar mobility,  Coordination, Flexibility, Mobility, Sensation, Wound, FMC       Visit Diagnosis: Pain in joint of right hand  Pain in right finger(s)  Generalized edema  Muscle weakness (generalized)  Other lack of coordination    Problem List Patient Active Problem List   Diagnosis Date Noted  . Sports physical 07/03/2011    Alm Bustard, OTR/L 09/18/2019, 12:16 PM  [facility] [address] [place], [place], [address] Phone: [phone]   Fax:  [phone]  Name: ASHFORD CLOUSE MRN: [id number] Date of Birth: [birth year]

## 2019-09-18 NOTE — Patient Instructions (Signed)
Do each exercise below 2 sets of  x10 reps. Hold at end range for count of 3-5 seconds. Do these at least 6 times a day. Wear splint between exercises and when needing protection.   Flexor Tendon Gliding (Active Straight Fist)    Start with fingers straight. Bend knuckles and middle joints. Keep fingertip joints straight to touch base of palm.   Copyright  VHI. All rights reserved.  Flexor Tendon Gliding (Active Hook Fist)    With fingers and knuckles straight, bend middle and tip joints. Do not bend large knuckles.   Copyright  VHI. All rights reserved.  Flexor Tendon Gliding (Active Full Fist)    Straighten all fingers, then make a fist, bending all joints.   Copyright  VHI. All rights reserved.

## 2019-09-19 ENCOUNTER — Ambulatory Visit: Payer: Self-pay | Admitting: Occupational Therapy

## 2019-09-25 ENCOUNTER — Ambulatory Visit: Payer: Self-pay | Admitting: Occupational Therapy

## 2019-10-02 ENCOUNTER — Ambulatory Visit: Payer: Self-pay | Attending: Orthopaedic Surgery | Admitting: *Deleted

## 2019-10-09 ENCOUNTER — Ambulatory Visit: Payer: Self-pay | Admitting: *Deleted

## 2019-11-23 ENCOUNTER — Other Ambulatory Visit: Payer: Self-pay

## 2019-11-23 ENCOUNTER — Encounter (HOSPITAL_COMMUNITY): Payer: Self-pay | Admitting: Emergency Medicine

## 2019-11-23 ENCOUNTER — Emergency Department (HOSPITAL_COMMUNITY)
Admission: EM | Admit: 2019-11-23 | Discharge: 2019-11-23 | Disposition: A | Discharge status: Home / Self Care | Payer: Self-pay | Attending: Emergency Medicine | Admitting: Emergency Medicine

## 2019-11-23 DIAGNOSIS — R369 Urethral discharge, unspecified: Secondary | ICD-10-CM | POA: Insufficient documentation

## 2019-11-23 LAB — URINALYSIS, ROUTINE W REFLEX MICROSCOPIC
Bilirubin Urine: NEGATIVE
Glucose, UA: NEGATIVE mg/dL
Hgb urine dipstick: NEGATIVE
Ketones, ur: NEGATIVE mg/dL
Leukocytes,Ua: NEGATIVE
Nitrite: NEGATIVE
Protein, ur: NEGATIVE mg/dL
Specific Gravity, Urine: 1.003 — ABNORMAL LOW (ref 1.005–1.030)
pH: 6 (ref 5.0–8.0)

## 2019-11-23 LAB — HIV ANTIBODY (ROUTINE TESTING W REFLEX): HIV Screen 4th Generation wRfx: NONREACTIVE

## 2019-11-23 MED ORDER — LIDOCAINE HCL (PF) 1 % IJ SOLN: 1.0000 mL | Freq: Once | INTRAMUSCULAR | Status: AC

## 2019-11-23 MED ORDER — CEFTRIAXONE SODIUM 500 MG IJ SOLR
500.0000 mg | Freq: Once | INTRAMUSCULAR | Status: AC
  Administered 2019-11-23: 500 mg via INTRAMUSCULAR
  Filled 2019-11-23: qty 500

## 2019-11-23 MED ORDER — DOXYCYCLINE HYCLATE 100 MG PO CAPS: 100.0000 mg | ORAL_CAPSULE | Freq: Two times a day (BID) | ORAL | 0 refills | Status: AC

## 2019-11-23 MED ORDER — LIDOCAINE HCL (PF) 1 % IJ SOLN
INTRAMUSCULAR | Status: AC
  Administered 2019-11-23: 1 mL
  Filled 2019-11-23: qty 2

## 2019-11-23 NOTE — ED Provider Notes (Signed)
[facility] EMERGENCY DEPARTMENT Provider Note   CSN: [id number] Arrival date & time: 11/23/19  1039     History Chief Complaint  Patient presents with  . SEXUALLY TRANSMITTED DISEASE    Kenneth Nelson is a 24 y.o. male.  Kenneth Nelson is a 24 y.o. male who is otherwise healthy, presents for evaluation of penile discharge.  Symptoms have been going on for the past few days.  Patient denies any associated dysuria, but reports frequent clear penile discharge.  Has not seen any genital lesions.  Denies any penile pain or pain in the testicles.  No abdominal pain.  No rectal pain or pain with defecation.  No fevers or chills.  Patient states that he is currently sexually active with one partner but states that he could have been exposed to something by that partner.  No other aggravating relieving factors.        History reviewed. No pertinent past medical history.  Patient Active Problem List   Diagnosis Date Noted  . Sports physical 07/03/2011    Past Surgical History:  Procedure Laterality Date  . OPEN REDUCTION INTERNAL FIXATION (ORIF) METACARPAL Right 08/18/2019   Procedure: OPEN REDUCTION INTERNAL FIXATION (ORIF) METACARPAL;  Surgeon: Ernest Mallick, MD;  Location: [facility] OR;  Service: Orthopedics;  Laterality: Right;       Family History  Problem Relation Age of Onset  . Sudden death Neg Hx   . Heart attack Neg Hx     Social History   Tobacco Use  . Smoking status: Never Smoker  . Smokeless tobacco: Never Used  Substance Use Topics  . Alcohol use: No  . Drug use: No    Home Medications Prior to Admission medications   Medication Sig Start Date End Date Taking? Authorizing Provider  doxycycline (VIBRAMYCIN) 100 MG capsule Take 1 capsule (100 mg total) by mouth 2 (two) times daily. Fill and begin taking prescription only if your chlamydia test is positive. 11/23/19   Dartha Lodge, PA-C  HYDROcodone-acetaminophen (NORCO) 5-325 MG tablet Take 1-2 tablets by  mouth every 6 (six) hours as needed for moderate pain. Patient not taking: Reported on 09/18/2019 08/18/19   Cain Saupe III, MD  sulfamethoxazole-trimethoprim (SEPTRA) 400-80 MG per tablet Take 1 tablet by mouth 2 (two) times daily. Patient not taking: Reported on 11/23/2019 11/20/12   Elson Areas, PA-C    Allergies    Patient has no known allergies.  Review of Systems   Review of Systems  Constitutional: Negative for chills and fever.  Gastrointestinal: Negative for abdominal pain, nausea and vomiting.  Genitourinary: Positive for discharge. Negative for dysuria, frequency, genital sores, hematuria, penile pain, penile swelling, scrotal swelling and testicular pain.  Skin: Negative for rash.    Physical Exam Updated Vital Signs BP 130/82 (BP Location: Right Arm)   Pulse 75   Temp 98.1 F (36.7 C) (Oral)   Resp 16   Ht 5\' 6"  (1.676 m)   Wt 64.4 kg   SpO2 99%   BMI 22.92 kg/m   Physical Exam Vitals and nursing note reviewed.  Constitutional:      General: He is not in acute distress.    Appearance: Normal appearance. He is well-developed. He is not ill-appearing or diaphoretic.  HENT:     Head: Normocephalic and atraumatic.  Eyes:     General:        Right eye: No discharge.        Left eye: No  discharge.  Pulmonary:     Effort: Pulmonary effort is normal. No respiratory distress.  Abdominal:     General: Abdomen is flat. Bowel sounds are normal. There is no distension.     Palpations: There is no mass.     Tenderness: There is no abdominal tenderness. There is no guarding.  Genitourinary:    Comments: Chaperone present during genital exam. No external genital lesions or inguinal lymphadenopathy noted. There is a small amount of clear discharge at the urinary meatus, penis without tenderness or mass. No pain tenderness or swelling over the testicles or scrotum. No pain over the spermatic cord. Skin:    General: Skin is warm and dry.  Neurological:      Mental Status: He is alert and oriented to person, place, and time.     Coordination: Coordination normal.  Psychiatric:        Mood and Affect: Mood normal.        Behavior: Behavior normal.     ED Results / Procedures / Treatments   Labs (all labs ordered are listed, but only abnormal results are displayed) Labs Reviewed  URINALYSIS, ROUTINE W REFLEX MICROSCOPIC - Abnormal; Notable for the following components:      Result Value   Color, Urine STRAW (*)    Specific Gravity, Urine 1.003 (*)    All other components within normal limits  RPR  HIV ANTIBODY (ROUTINE TESTING W REFLEX)  GC/CHLAMYDIA PROBE AMP ([place]) NOT AT [facility]    EKG None  Radiology No results found.  Procedures Procedures (including critical care time)  Medications Ordered in ED Medications  cefTRIAXone (ROCEPHIN) injection 500 mg (500 mg Intramuscular Given 11/23/19 1227)  lidocaine (PF) (XYLOCAINE) 1 % injection 1 mL (1 mL Other Given 11/23/19 1228)    ED Course  I have reviewed the triage vital signs and the nursing notes.  Pertinent labs & imaging results that were available during my care of the patient were reviewed by me and considered in my medical decision making (see chart for details).    MDM Rules/Calculators/A&P                          Patient presents with penile discharge and concern for STD.  Patient is afebrile without abdominal tenderness, abdominal pain or painful bowel movements to indicate prostatitis.  No tenderness to palpation of the testes or epididymis to suggest orchitis or epididymitis.  STD cultures obtained including HIV, syphilis, gonorrhea and chlamydia. Patient to be discharged with instructions to follow up with PCP. Discussed importance of using protection when sexually active. Pt understands that they have GC/Chlamydia cultures pending and that they will need to inform all sexual partners if results return positive. Patient has been treated prophylactically with  Rocephin, and provided prescription for doxycycline and instructed to take this only if chlamydia testing is positive.    Final Clinical Impression(s) / ED Diagnoses Final diagnoses:  Penile discharge    Rx / DC Orders ED Discharge Orders         Ordered    doxycycline (VIBRAMYCIN) 100 MG capsule  2 times daily        11/23/19 1257           Jodi Geralds [place], [place] 11/23/19 1525    Pricilla Loveless, MD 11/23/19 1601

## 2019-11-23 NOTE — Discharge Instructions (Addendum)
You were tested today for gonorrhea and chlamydia, you have already been treated for gonorrhea, and if your chlamydia test is positive you will need to fill and begin taking doxycycline twice daily for the next 7 days.  You were also tested for HIV and syphilis. You will be called in the next 2 to 3 days if any results are positive, you can also review negative results online.  You will need to notify any partners of any positive results so they can be tested and treated as well.  You should avoid any sexual activity until you have completed all treatments and symptoms have resolved.  Please make sure you are using protection when sexually active to help prevent further STDs.  You can go to the health department for any future STD testing needs.

## 2019-11-23 NOTE — ED Notes (Addendum)
In while  PA exam of male genitalia

## 2019-11-23 NOTE — ED Triage Notes (Signed)
Pt with penile discharge   Here for STD check

## 2019-11-24 LAB — RPR: RPR Ser Ql: NONREACTIVE

## 2019-11-25 LAB — GC/CHLAMYDIA PROBE AMP (~~LOC~~) NOT AT ARMC
Chlamydia: POSITIVE — AB
Comment: NEGATIVE
Comment: NORMAL
Neisseria Gonorrhea: NEGATIVE

## 2019-11-28 ENCOUNTER — Telehealth: Payer: Self-pay | Admitting: *Deleted

## 2019-11-28 NOTE — Telephone Encounter (Signed)
Pt called regarding which pharmacy Rx was e-scribed to.  RNCM reviewed chart to access After Visit Summary and found that Rx was printied. Advised pt to take Rx to pharmacy of choice to have filled.

## 2020-05-30 ENCOUNTER — Emergency Department (HOSPITAL_BASED_OUTPATIENT_CLINIC_OR_DEPARTMENT_OTHER)
Admission: EM | Admit: 2020-05-30 | Discharge: 2020-05-30 | Disposition: A | Discharge status: Home / Self Care | Payer: BC Managed Care – PPO | Attending: Emergency Medicine | Admitting: Emergency Medicine

## 2020-05-30 ENCOUNTER — Other Ambulatory Visit: Payer: Self-pay

## 2020-05-30 ENCOUNTER — Encounter (HOSPITAL_BASED_OUTPATIENT_CLINIC_OR_DEPARTMENT_OTHER): Payer: Self-pay | Admitting: Emergency Medicine

## 2020-05-30 ENCOUNTER — Emergency Department (HOSPITAL_BASED_OUTPATIENT_CLINIC_OR_DEPARTMENT_OTHER): Payer: BC Managed Care – PPO

## 2020-05-30 DIAGNOSIS — R519 Headache, unspecified: Secondary | ICD-10-CM | POA: Diagnosis present

## 2020-05-30 DIAGNOSIS — U071 COVID-19: Secondary | ICD-10-CM | POA: Diagnosis not present

## 2020-05-30 LAB — RESP PANEL BY RT-PCR (FLU A&B, COVID) ARPGX2
Influenza A by PCR: NEGATIVE
Influenza B by PCR: NEGATIVE
SARS Coronavirus 2 by RT PCR: POSITIVE — AB

## 2020-05-30 NOTE — ED Provider Notes (Signed)
[facility] EMERGENCY DEPARTMENT Provider Note   CSN: [id number] Arrival date & time: 05/30/20  0957     History Chief Complaint  Patient presents with  . Cough    Kenneth Nelson is a 25 y.o. male.  HPI   Patient presents to the ED for evaluation of headache cough congestion and fever.  Patient states the symptoms started last night.  He started having headache and cough.  Cough has been nonproductive.  He developed some pain in the back of his chest.  This morning he had a fever up to 102.  She states it hurts when he takes a deep breath or when he coughs.  He has not had any lightheadedness.  No vomiting or diarrhea.  No shortness of breath.  No sore throat.  No earache.  Patient has been vaccinated for COVID.  He has not been vaccinated for flu  History reviewed. No pertinent past medical history.  Patient Active Problem List   Diagnosis Date Noted  . Sports physical 07/03/2011    Past Surgical History:  Procedure Laterality Date  . OPEN REDUCTION INTERNAL FIXATION (ORIF) METACARPAL Right 08/18/2019   Procedure: OPEN REDUCTION INTERNAL FIXATION (ORIF) METACARPAL;  Surgeon: Ernest Mallick, MD;  Location: [facility] OR;  Service: Orthopedics;  Laterality: Right;       Family History  Problem Relation Age of Onset  . Sudden death Neg Hx   . Heart attack Neg Hx     Social History   Tobacco Use  . Smoking status: Never Smoker  . Smokeless tobacco: Never Used  Vaping Use  . Vaping Use: Every day  Substance Use Topics  . Alcohol use: No  . Drug use: No    Home Medications Prior to Admission medications   Medication Sig Start Date End Date Taking? Authorizing Provider  doxycycline (VIBRAMYCIN) 100 MG capsule Take 1 capsule (100 mg total) by mouth 2 (two) times daily. Fill and begin taking prescription only if your chlamydia test is positive. 11/23/19   Dartha Lodge, PA-C  HYDROcodone-acetaminophen (NORCO) 5-325 MG tablet Take 1-2 tablets by mouth every 6  (six) hours as needed for moderate pain. Patient not taking: Reported on 09/18/2019 08/18/19   Cain Saupe III, MD  sulfamethoxazole-trimethoprim (SEPTRA) 400-80 MG per tablet Take 1 tablet by mouth 2 (two) times daily. Patient not taking: Reported on 11/23/2019 11/20/12   Elson Areas, PA-C    Allergies    Patient has no known allergies.  Review of Systems   Review of Systems  All other systems reviewed and are negative.   Physical Exam Updated Vital Signs BP 122/70   Pulse 87   Temp 99.3 F (37.4 C) (Oral)   Resp 17   Ht 1.727 m (5\' 8" )   Wt 62.1 kg   SpO2 100%   BMI 20.83 kg/m   Physical Exam Vitals and nursing note reviewed.  Constitutional:      General: He is not in acute distress.    Appearance: He is well-developed.  HENT:     Head: Normocephalic and atraumatic.     Right Ear: External ear normal.     Left Ear: External ear normal.  Eyes:     General: No scleral icterus.       Right eye: No discharge.        Left eye: No discharge.     Conjunctiva/sclera: Conjunctivae normal.  Neck:     Trachea: No tracheal deviation.  Cardiovascular:  Rate and Rhythm: Normal rate and regular rhythm.  Pulmonary:     Effort: Pulmonary effort is normal. No respiratory distress.     Breath sounds: Normal breath sounds. No stridor. No wheezing or rales.  Abdominal:     General: Bowel sounds are normal. There is no distension.     Palpations: Abdomen is soft.     Tenderness: There is no abdominal tenderness. There is no guarding or rebound.  Musculoskeletal:        General: No tenderness.     Cervical back: Neck supple.  Skin:    General: Skin is warm and dry.     Findings: No rash.  Neurological:     Mental Status: He is alert.     Cranial Nerves: No cranial nerve deficit (no facial droop, extraocular movements intact, no slurred speech).     Sensory: No sensory deficit.     Motor: No abnormal muscle tone or seizure activity.     Coordination: Coordination  normal.     ED Results / Procedures / Treatments   Labs (all labs ordered are listed, but only abnormal results are displayed) Labs Reviewed  RESP PANEL BY RT-PCR (FLU A&B, COVID) ARPGX2 - Abnormal; Notable for the following components:      Result Value   SARS Coronavirus 2 by RT PCR POSITIVE (*)    All other components within normal limits    EKG None  Radiology DG Chest Port 1 View  Result Date: 05/30/2020 CLINICAL DATA:  25 year old male with chest pain. Cough, back pain, congestion and fever since last night. EXAM: PORTABLE CHEST 1 VIEW COMPARISON:  None. FINDINGS: Portable AP upright view at 1021 hours. Normal lung volumes and mediastinal contours. Visualized tracheal air column is within normal limits. Allowing for portable technique the lungs are clear. No pneumothorax or pleural effusion. No osseous abnormality identified. IMPRESSION: Negative portable chest. Electronically Signed   By: Odessa Fleming M.D.   On: 05/30/2020 10:51    Procedures Procedures   Medications Ordered in ED Medications - No data to display  ED Course  I have reviewed the triage vital signs and the nursing notes.  Pertinent labs & imaging results that were available during my care of the patient were reviewed by me and considered in my medical decision making (see chart for details).  Clinical Course as of 05/30/20 1158  Sat May 30, 2020  1104 Chest x-ray does not show pneumonia [JK]    Clinical Course User Index [JK] Linwood Dibbles, MD   MDM Rules/Calculators/A&P                          Patient presented with complaints of fever and pleuritic chest pain.  Patient is nontoxic and well-appearing.  He does not have an oxygen requirement.  Chest x-ray did not show evidence of pneumonia.  His COVID flu test was positive for COVID-19.  Patient has been fully vaccinated and does not have any significant risk factors.  Discussed supportive care at home.  Warning signs and precautions discussed Final Clinical  Impression(s) / ED Diagnoses Final diagnoses:  COVID-19 virus infection    Rx / DC Orders ED Discharge Orders    None       Linwood Dibbles, MD 05/30/20 1159

## 2020-05-30 NOTE — Discharge Instructions (Addendum)
Take over-the-counter medications as needed.  Return to the ER if you start having trouble with shortness of breath

## 2020-05-30 NOTE — ED Triage Notes (Signed)
Pt arrives pov with c/o HA, cough, back pain, congestion and fever that started last night. Pt reports chest hurts when coughing and deep inspiration. Pt denies dysuria

## 2020-05-31 ENCOUNTER — Telehealth: Payer: Self-pay | Admitting: Physician Assistant

## 2020-05-31 NOTE — Telephone Encounter (Signed)
Called to discuss with patient about COVID-19 symptoms and the use of one of the available treatments for those with mild to moderate Covid symptoms and at a high risk of hospitalization.  Pt appears to qualify for outpatient treatment due to co-morbid conditions and/or a member of an at-risk group in accordance with the FDA Emergency Use Authorization.    Symptom onset: 5/6 per provider notes Vaccinated: yes Booster? unknowne Immunocompromised? no Qualifiers: SVI of 3 NIH Criteria: unknowns  Unable to reach pt - left VM and mychart msg   [employer]

## 2021-04-03 ENCOUNTER — Encounter (HOSPITAL_BASED_OUTPATIENT_CLINIC_OR_DEPARTMENT_OTHER): Payer: Self-pay | Admitting: Emergency Medicine

## 2021-04-03 ENCOUNTER — Other Ambulatory Visit: Payer: Self-pay

## 2021-04-03 ENCOUNTER — Emergency Department (HOSPITAL_BASED_OUTPATIENT_CLINIC_OR_DEPARTMENT_OTHER)
Admission: EM | Admit: 2021-04-03 | Discharge: 2021-04-03 | Disposition: A | Discharge status: Home / Self Care | Payer: BC Managed Care – PPO | Attending: Emergency Medicine | Admitting: Emergency Medicine

## 2021-04-03 DIAGNOSIS — B9689 Other specified bacterial agents as the cause of diseases classified elsewhere: Secondary | ICD-10-CM

## 2021-04-03 DIAGNOSIS — H109 Unspecified conjunctivitis: Secondary | ICD-10-CM

## 2021-04-03 DIAGNOSIS — H5789 Other specified disorders of eye and adnexa: Secondary | ICD-10-CM | POA: Insufficient documentation

## 2021-04-03 MED ORDER — ERYTHROMYCIN 5 MG/GM OP OINT
TOPICAL_OINTMENT | Freq: Once | OPHTHALMIC | Status: AC
  Administered 2021-04-03: 1 via OPHTHALMIC
  Filled 2021-04-03: qty 3.5

## 2021-04-03 NOTE — ED Notes (Signed)
Pt c/o redness to bilateral eyes, and some crusty drainage.   ?

## 2021-04-03 NOTE — ED Provider Notes (Signed)
?[facility] EMERGENCY DEPARTMENT ?Provider Note ? ? ?CSN: [id number] ?Arrival date & time: 04/03/21  1443 ? ?  ? ?History ? ?Chief Complaint  ?Patient presents with  ? Eye Problem  ? ? ?Kenneth Nelson is a 26 y.o. male. ? ? ?Eye Problem ? ?Patient presents with bilateral eye redness and discharge.  Initially started with the right eye 5 days ago, spread to the left eye 3 days ago.  He does not wear contacts, denies any blurry vision.  Wakes up with his eyes stuck together, endorses purulent discharge.  No vision changes, has not tried anything to make it better yet.  Denies any associated symptoms. ? ?Home Medications ?Prior to Admission medications   ?Medication Sig Start Date End Date Taking? Authorizing Provider  ?doxycycline (VIBRAMYCIN) 100 MG capsule Take 1 capsule (100 mg total) by mouth 2 (two) times daily. Fill and begin taking prescription only if your chlamydia test is positive. 11/23/19   Dartha Lodge, PA-C  ?HYDROcodone-acetaminophen (NORCO) 5-325 MG tablet Take 1-2 tablets by mouth every 6 (six) hours as needed for moderate pain. ?Patient not taking: Reported on 09/18/2019 08/18/19   Cain Saupe III, MD  ?sulfamethoxazole-trimethoprim (SEPTRA) 400-80 MG per tablet Take 1 tablet by mouth 2 (two) times daily. ?Patient not taking: Reported on 11/23/2019 11/20/12   Elson Areas, PA-C  ?   ? ?Allergies    ?Patient has no known allergies.   ? ?Review of Systems   ?Review of Systems ? ?Physical Exam ?Updated Vital Signs ?BP 123/84   Pulse 71   Temp 98.6 ?F (37 ?C) (Oral)   Resp 18   Ht 5\' 8"  (1.727 m)   Wt 61.2 kg   SpO2 99%   BMI 20.53 kg/m?  ?Physical Exam ?Vitals and nursing note reviewed. Exam conducted with a chaperone present.  ?Constitutional:   ?   General: He is not in acute distress. ?   Appearance: Normal appearance.  ?HENT:  ?   Head: Normocephalic and atraumatic.  ?Eyes:  ?   General: No scleral icterus.    ?   Right eye: Discharge present.     ?   Left eye: Discharge  present. ?   Extraocular Movements: Extraocular movements intact.  ?   Pupils: Pupils are equal, round, and reactive to light.  ?   Comments: EOMI, no periorbital erythema or swelling  ?Skin: ?   Coloration: Skin is not jaundiced.  ?Neurological:  ?   Mental Status: He is alert. Mental status is at baseline.  ?   Coordination: Coordination normal.  ? ? ?ED Results / Procedures / Treatments   ?Labs ?(all labs ordered are listed, but only abnormal results are displayed) ?Labs Reviewed - No data to display ? ?EKG ?None ? ?Radiology ?No results found. ? ?Procedures ?Procedures  ? ? ?Medications Ordered in ED ?Medications  ?erythromycin ophthalmic ointment (has no administration in time range)  ? ? ?ED Course/ Medical Decision Making/ A&P ?  ?                        ?Medical Decision Making ?Risk ?Prescription drug management. ? ? ?26 year old male with no contributing comorbidities presents due to bilateral erythema to the eyes and discharge.  Viral versus bacterial conjunctivitis, EOMI without any vision changes or complaints.  There is no surrounding erythema or swelling so although I did consider I think ultimately preorbital or orbital cellulitis is unlikely.  Will discharge  with with histamine meant and have him follow-up with an eye doctor.  Do not think additional work-up warranted at this time. ? ? ? ? ? ? ? ?Final Clinical Impression(s) / ED Diagnoses ?Final diagnoses:  ?None  ? ? ?Rx / DC Orders ?ED Discharge Orders   ? ? None  ? ?  ? ? ?  ?Theron Arista, PA-C ?04/03/21 1707 ? ?  ?Rolan Bucco, MD ?04/03/21 1744 ? ?

## 2021-04-03 NOTE — ED Triage Notes (Signed)
Pt arrives pov, steady gait to triage c/o bilateral eye redness and drainage x 4 days ?

## 2021-04-03 NOTE — Discharge Instructions (Signed)
Use the antibiotic ointment 3 times daily for the next week.  Follow-up with your primary symptoms persist.  Return if worse ?

## 2022-03-18 ENCOUNTER — Emergency Department (HOSPITAL_COMMUNITY)
Admission: EM | Admit: 2022-03-18 | Discharge: 2022-03-18 | Disposition: A | Discharge status: Home / Self Care | Payer: BC Managed Care – PPO | Attending: Emergency Medicine | Admitting: Emergency Medicine

## 2022-03-18 ENCOUNTER — Encounter (HOSPITAL_COMMUNITY): Payer: Self-pay

## 2022-03-18 ENCOUNTER — Other Ambulatory Visit: Payer: Self-pay

## 2022-03-18 DIAGNOSIS — K047 Periapical abscess without sinus: Secondary | ICD-10-CM | POA: Diagnosis not present

## 2022-03-18 DIAGNOSIS — K0889 Other specified disorders of teeth and supporting structures: Secondary | ICD-10-CM | POA: Diagnosis present

## 2022-03-18 MED ORDER — BENZOCAINE 20 % MT AERO
INHALATION_SPRAY | Freq: Once | OROMUCOSAL | Status: AC
  Filled 2022-03-18: qty 57

## 2022-03-18 MED ORDER — CLINDAMYCIN HCL 150 MG PO CAPS: 450.0000 mg | ORAL_CAPSULE | Freq: Three times a day (TID) | ORAL | 0 refills | Status: AC

## 2022-03-18 MED ORDER — CLINDAMYCIN HCL 300 MG PO CAPS
450.0000 mg | ORAL_CAPSULE | Freq: Once | ORAL | Status: AC
  Administered 2022-03-18: 450 mg via ORAL
  Filled 2022-03-18: qty 1

## 2022-03-18 NOTE — ED Triage Notes (Signed)
Right lower side dental pain that started yesterday. States he may have had a fever earlier. Slight relief in pain with tylenol and ibuprofen. Denies chills, body aches.

## 2022-03-18 NOTE — ED Provider Notes (Signed)
[facility] Provider Note   CSN: [id number] Arrival date & time: 03/18/22  1948     History  Chief Complaint  Patient presents with   Dental Pain    Kenneth Nelson is a 27 y.o. male.  27 yo M with a cc of right lower dental pain.  Going on for a couple days.  Worsening swelling over the past 12 hours or so.  Subjective fevers at home.  Called his dentist suggested he come here to get some antibiotics.    Dental Pain      Home Medications Prior to Admission medications   Medication Sig Start Date End Date Taking? Authorizing Provider  clindamycin (CLEOCIN) 150 MG capsule Take 3 capsules (450 mg total) by mouth 3 (three) times daily for 7 days. 03/18/22 03/25/22 Yes Deno Etienne, DO  doxycycline (VIBRAMYCIN) 100 MG capsule Take 1 capsule (100 mg total) by mouth 2 (two) times daily. Fill and begin taking prescription only if your chlamydia test is positive. 11/23/19   Jacqlyn Larsen, PA-C  HYDROcodone-acetaminophen (NORCO) 5-325 MG tablet Take 1-2 tablets by mouth every 6 (six) hours as needed for moderate pain. Patient not taking: Reported on 09/18/2019 08/18/19   Avanell Shackleton III, MD  sulfamethoxazole-trimethoprim (SEPTRA) 400-80 MG per tablet Take 1 tablet by mouth 2 (two) times daily. Patient not taking: Reported on 11/23/2019 11/20/12   Fransico Meadow, PA-C      Allergies    Patient has no known allergies.    Review of Systems   Review of Systems  Physical Exam Updated Vital Signs BP (!) 141/85   Pulse 78   Temp 98.6 F (37 C) (Oral)   Resp 17   Ht '5\' 8"'$  (1.727 m)   Wt 59 kg   SpO2 100%   BMI 19.77 kg/m  Physical Exam Vitals and nursing note reviewed.  Constitutional:      Appearance: He is well-developed.  HENT:     Head: Normocephalic and atraumatic.     Mouth/Throat:     Comments: Erosion to the R lower 1rst molar, swelling into the jaw.  No obvious are of fluctuance.  Exam limited due to discomfort.  No  sublingual swelling.  Eyes:     Pupils: Pupils are equal, round, and reactive to light.  Neck:     Vascular: No JVD.  Cardiovascular:     Rate and Rhythm: Normal rate and regular rhythm.     Heart sounds: No murmur heard.    No friction rub. No gallop.  Pulmonary:     Effort: No respiratory distress.     Breath sounds: No wheezing.  Abdominal:     General: There is no distension.     Tenderness: There is no abdominal tenderness. There is no guarding or rebound.  Musculoskeletal:        General: Normal range of motion.     Cervical back: Normal range of motion and neck supple.  Skin:    Coloration: Skin is not pale.     Findings: No rash.  Neurological:     Mental Status: He is alert and oriented to person, place, and time.  Psychiatric:        Behavior: Behavior normal.     ED Results / Procedures / Treatments   Labs (all labs ordered are listed, but only abnormal results are displayed) Labs Reviewed - No data to display  EKG None  Radiology No results found.  Procedures Dental Block  Date/Time: 03/18/2022 8:55 PM  Performed by: Deno Etienne, DO Authorized by: Deno Etienne, DO   Consent:    Consent obtained:  Verbal   Consent given by:  Patient   Risks, benefits, and alternatives were discussed: yes     Risks discussed:  Allergic reaction, infection and nerve damage   Alternatives discussed:  No treatment and alternative treatment Universal protocol:    Procedure explained and questions answered to patient or proxy's satisfaction: no     Immediately prior to procedure, a time out was called: no     Patient identity confirmed:  Arm band Indications:    Indications: dental pain   Location:    Block type:  Inferior alveolar   Laterality:  Right Procedure details:    Topical anesthetic:  Benzocaine spray   Syringe type:  Aspirating dental syringe   Needle gauge:  27 G   Anesthetic injected:  Bupivacaine 0.5% WITH epi   Injection procedure:  Anatomic landmarks  identified, anatomic landmarks palpated and negative aspiration for blood Post-procedure details:    Outcome:  Pain improved   Procedure completion:  Tolerated well, no immediate complications .Marland KitchenIncision and Drainage  Date/Time: 03/18/2022 9:24 PM  Performed by: Deno Etienne, DO Authorized by: Deno Etienne, DO   Consent:    Consent obtained:  Verbal   Consent given by:  Patient   Risks, benefits, and alternatives were discussed: yes     Risks discussed:  Bleeding, incomplete drainage and infection   Alternatives discussed:  No treatment, delayed treatment and alternative treatment Universal protocol:    Procedure explained and questions answered to patient or proxy's satisfaction: yes     Patient identity confirmed:  Verbally with patient Location:    Type:  Abscess   Location:  Mouth   Mouth location:  Floor of mouth Sedation:    Sedation type:  None Anesthesia:    Anesthesia method:  Nerve block   Block location:  Inferior alveolar   Block needle gauge:  27 G   Block anesthetic:  Bupivacaine 0.5% WITH epi   Block injection procedure:  Anatomic landmarks identified, anatomic landmarks palpated, introduced needle and negative aspiration for blood   Block outcome:  Anesthesia achieved Procedure type:    Complexity:  Complex Procedure details:    Incision types:  Stab incision   Incision depth:  Subcutaneous   Drainage:  Bloody and purulent   Drainage amount:  Scant   Wound treatment:  Wound left open   Packing materials:  None Post-procedure details:    Procedure completion:  Tolerated well, no immediate complications     Medications Ordered in ED Medications  Benzocaine (HURRCAINE) 20 % mouth spray ( Mouth/Throat Given by Other 03/18/22 2045)  clindamycin (CLEOCIN) capsule 450 mg (450 mg Oral Given 03/18/22 2051)    ED Course/ Medical Decision Making/ A&P                             Medical Decision Making  27 yo M with a chief complaint of right lower dental pain.   Going on for a few days now.  He developed some right-sided jaw swelling.  He had some subjective fevers at home.  No difficulty swallowing.  Significant edema on exam.  Dental block performed and an area of fluctuance was palpated.  I&D performed.  Scant purulence.  Started on antibiotics.  Dentistry follow-up.   9:25 PM:  I have  discussed the diagnosis/risks/treatment options with the patient.  Evaluation and diagnostic testing in the emergency department does not suggest an emergent condition requiring admission or immediate intervention beyond what has been performed at this time.  They will follow up with Dentist. We also discussed returning to the ED immediately if new or worsening sx occur. We discussed the sx which are most concerning (e.g., sudden worsening pain, fever, inability to tolerate by mouth) that necessitate immediate return. Medications administered to the patient during their visit and any new prescriptions provided to the patient are listed below.  Medications given during this visit Medications  Benzocaine (HURRCAINE) 20 % mouth spray ( Mouth/Throat Given by Other 03/18/22 2045)  clindamycin (CLEOCIN) capsule 450 mg (450 mg Oral Given 03/18/22 2051)     The patient appears reasonably screen and/or stabilized for discharge and I doubt any other medical condition or other [facility] requiring further screening, evaluation, or treatment in the ED at this time prior to discharge.          Final Clinical Impression(s) / ED Diagnoses Final diagnoses:  Dental abscess    Rx / DC Orders ED Discharge Orders          Ordered    clindamycin (CLEOCIN) 150 MG capsule  3 times daily        03/18/22 2123              Deno Etienne, DO 03/18/22 2125

## 2022-03-18 NOTE — ED Notes (Signed)
Dr. Floyd at bedside. 

## 2022-03-18 NOTE — Discharge Instructions (Addendum)
Take 4 over the counter ibuprofen tablets 3 times a day or 2 over-the-counter naproxen tablets twice a day for pain. Also take tylenol '1000mg'$ (2 extra strength) four times a day.   Return for rapid swelling fever or difficulty swallowing.  Otherwise follow up with your dentist.

## 2022-12-18 IMAGING — DX DG CHEST 1V PORT
1 series · 1 of 1 positions shown · non-contrast
Comparison: None.

CLINICAL DATA: 24-year-old male with chest pain. Cough, back pain,
congestion and fever since last night.

EXAM:
PORTABLE CHEST 1 VIEW

[chest ap]
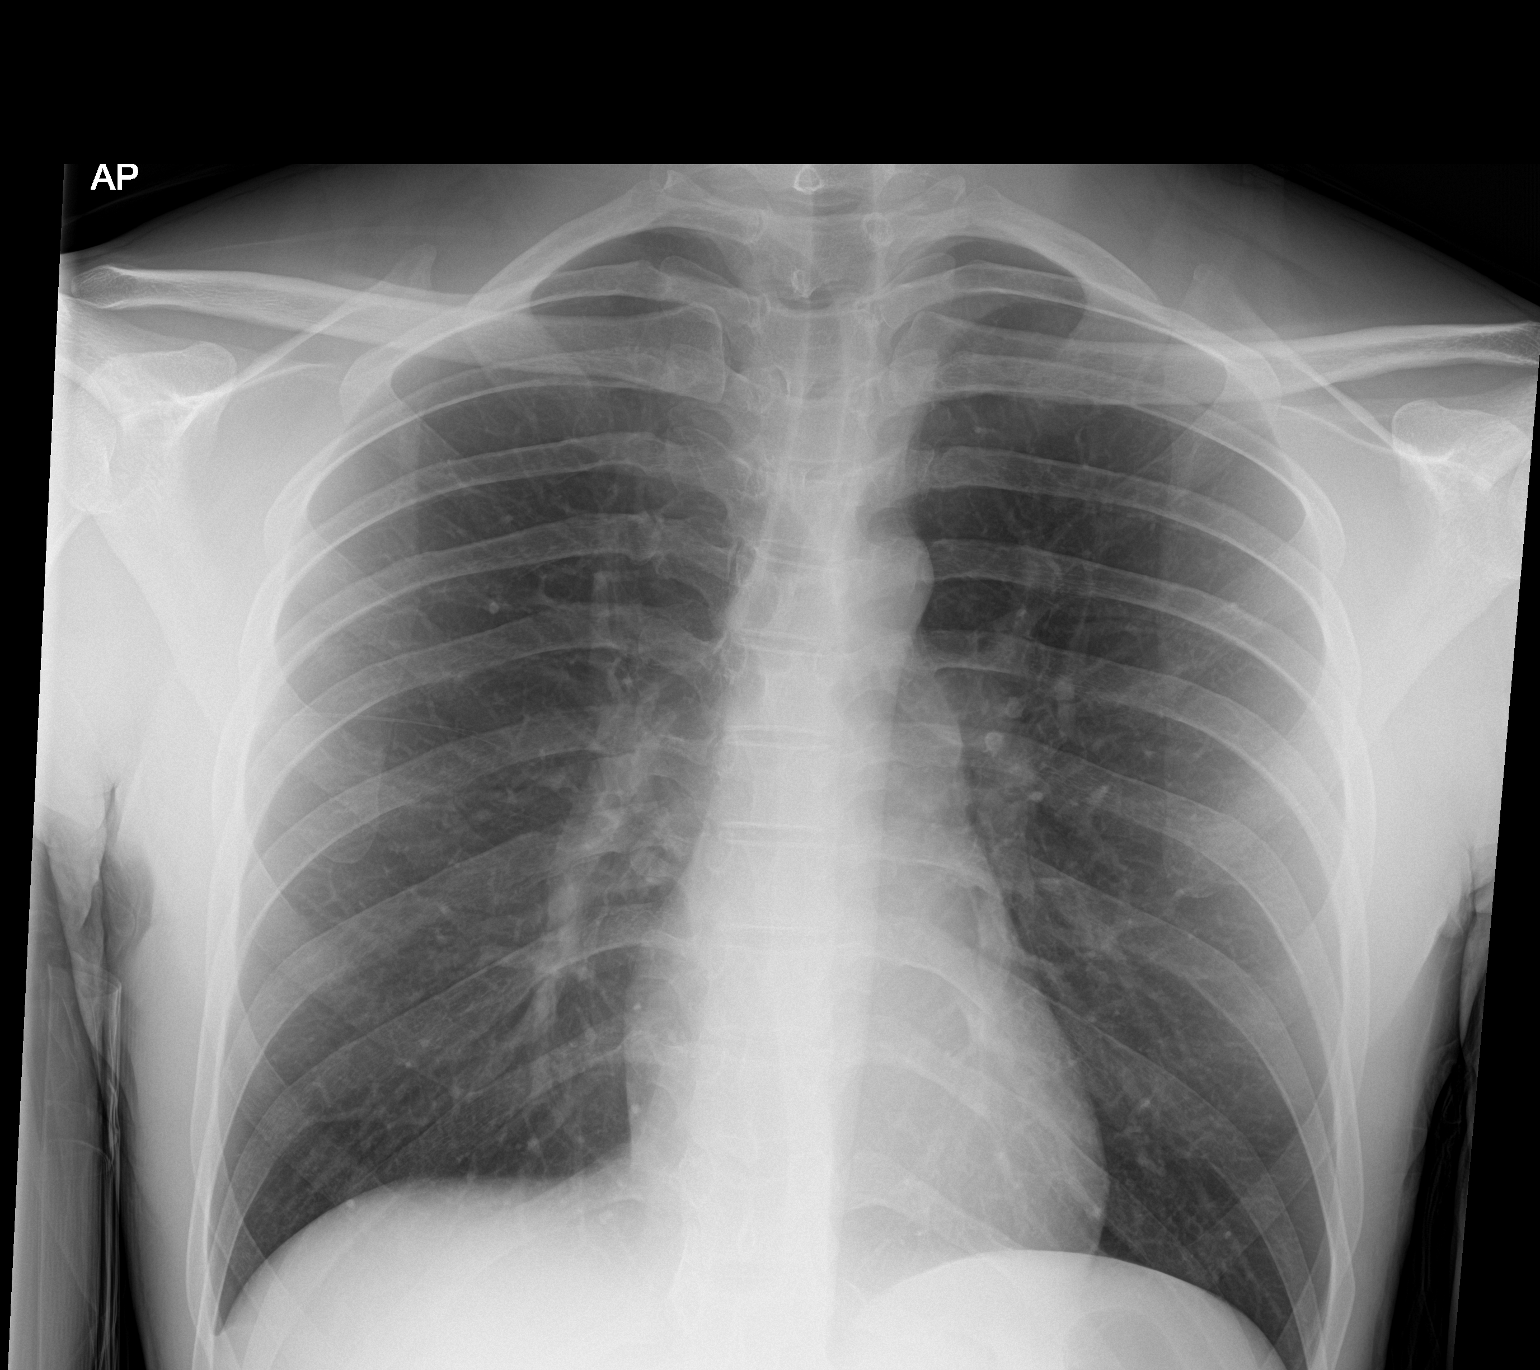

[1 of 1 positions shown; findings below may reference images not displayed]

FINDINGS: Portable AP upright view at 5515 hours. Normal lung volumes and
mediastinal contours. Visualized tracheal air column is within
normal limits. Allowing for portable technique the lungs are clear.
No pneumothorax or pleural effusion. No osseous abnormality
identified.
IMPRESSION: Negative portable chest.

## 2023-07-31 ENCOUNTER — Other Ambulatory Visit: Payer: Self-pay

## 2023-07-31 ENCOUNTER — Emergency Department

## 2023-07-31 ENCOUNTER — Encounter: Payer: Self-pay | Admitting: Emergency Medicine

## 2023-07-31 ENCOUNTER — Emergency Department
Admission: EM | Admit: 2023-07-31 | Discharge: 2023-07-31 | Disposition: A | Discharge status: Home / Self Care | Source: Ambulatory Visit | Attending: Emergency Medicine | Admitting: Emergency Medicine

## 2023-07-31 DIAGNOSIS — D72819 Decreased white blood cell count, unspecified: Secondary | ICD-10-CM | POA: Diagnosis not present

## 2023-07-31 DIAGNOSIS — R079 Chest pain, unspecified: Secondary | ICD-10-CM | POA: Insufficient documentation

## 2023-07-31 LAB — BASIC METABOLIC PANEL WITH GFR
Anion gap: 10 (ref 5–15)
BUN: 13 mg/dL (ref 6–20)
CO2: 25 mmol/L (ref 22–32)
Calcium: 9.2 mg/dL (ref 8.9–10.3)
Chloride: 103 mmol/L (ref 98–111)
Creatinine, Ser: 1.07 mg/dL (ref 0.61–1.24)
GFR, Estimated: >60 mL/min (ref >60)
Glucose, Bld: 85 mg/dL (ref 70–99)
Potassium: 3.8 mmol/L (ref 3.5–5.1)
Sodium: 138 mmol/L (ref 135–145)

## 2023-07-31 LAB — CBC
HCT: 45.6 % (ref 39.0–52.0)
Hemoglobin: 14.8 g/dL (ref 13.0–17.0)
MCH: 26.7 pg (ref 26.0–34.0)
MCHC: 32.5 g/dL (ref 30.0–36.0)
MCV: 82.3 fL (ref 80.0–100.0)
Platelets: 235 K/uL (ref 150–400)
RBC: 5.54 MIL/uL (ref 4.22–5.81)
RDW: 12.3 % (ref 11.5–15.5)
WBC: 3.9 K/uL — ABNORMAL LOW (ref 4.0–10.5)
nRBC: 0 % (ref 0.0–0.2)

## 2023-07-31 LAB — TROPONIN I (HIGH SENSITIVITY)
Troponin I (High Sensitivity): 2 ng/L (ref <18)
Troponin I (High Sensitivity): 4 ng/L (ref <18)

## 2023-07-31 NOTE — ED Triage Notes (Signed)
 Patient to ED via POV for left sided CP. Ongoing x2 hours. Seen at [facility] but did not have x-ray machine. Denies drug or alcohol use. Denies cardiac hx.

## 2023-07-31 NOTE — Discharge Instructions (Signed)
 [facility] are not improving.  Return to the emergency department for symptoms of change or worsen if you are unable to schedule an appointment.

## 2023-07-31 NOTE — ED Provider Notes (Signed)
   [facility] Provider Note    Event Date/Time   First MD Initiated Contact with Patient 07/31/23 1843     (approximate)   History   Chest Pain   HPI  Kenneth Nelson is a 28 y.o. male with no significant past medical history and as listed in EMR presents to the emergency department for treatment and evaluation of left-sided chest pressure ongoing for 2 hours prior to arrival.  Pain started while in the grocery store.  He denies cardiac history.  Pressure is intermittent and does not improve or worsen with change of position.  Pain does not increase with deep breath.  He cannot recall any injury or reason for muscle strain.     Physical Exam    Vitals:   07/31/23 2013 07/31/23 2153  BP: 124/81 112/84  Pulse: 60 66  Resp: 16 16  Temp: 98.1 F (36.7 C) 98.1 F (36.7 C)  SpO2: 100% 97%    General: Awake, no distress.  CV:  Good peripheral perfusion.  Resp:  Normal effort.  Abd:  No distention.  Other:     ED Results / Procedures / Treatments   Labs (all labs ordered are listed, but only abnormal results are displayed)  Labs Reviewed  CBC - Abnormal; Notable for the following components:      Result Value   WBC 3.9 (*)    All other components within normal limits  BASIC METABOLIC PANEL WITH GFR  TROPONIN I (HIGH SENSITIVITY)  TROPONIN I (HIGH SENSITIVITY)     EKG  With a ventricular rate of 63.   RADIOLOGY  Image and radiology report reviewed and interpreted by me. Radiology report consistent with the same.  Chest x-ray negative for acute cardiopulmonary abnormality.  PROCEDURES:  Critical Care performed: No  Procedures   MEDICATIONS ORDERED IN ED:  Medications - No data to display   IMPRESSION / MDM / ASSESSMENT AND PLAN / ED COURSE   I have reviewed the triage note and vital signs. Vital signs normal.   Differential diagnosis includes, but is not limited to, musculoskeletal strain, pericarditis, PE Patient's  presentation is most consistent with acute illness / injury with system symptoms.  28 year old male presenting to the emergency department for treatment and evaluation of left side chest pressure that started 2 hours prior to arrival.  See HPI for further details.  Labs obtained while awaiting ER room assignment are reassuring.  Initial troponin is 2.  [facility] and brought a copy of his EKG which shows a normal sinus rhythm without ectopy.  Plan will be to await second troponin and repeat EKG here.  Serial troponins and repeat EKG are reassuring.  Results reviewed with the patient.  He will be discharged home with ER return precautions.  He is to call and schedule follow-[facility] appointment [facility].      FINAL CLINICAL IMPRESSION(S) / ED DIAGNOSES   Final diagnoses:  Nonspecific chest pain     Rx / DC Orders   ED Discharge Orders     None        Note:  This document was prepared using Dragon voice recognition software and may include unintentional dictation errors.   Herlinda Kirk NOVAK, FNP 07/31/23 [id number]    Willo Dunnings, MD 07/31/23 [phone]
